# Patient Record
Sex: Male | Born: 1990 | Race: Black or African American | Hispanic: No | Marital: Single | Smoking: Current every day smoker
Health system: Southern US, Community
[De-identification: ages and names within clinical notes are randomized; demographics above are authoritative.]

## PROBLEM LIST (undated history)

## (undated) DIAGNOSIS — J45909 Unspecified asthma, uncomplicated: Secondary | ICD-10-CM

---
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
## 2011-04-17 ENCOUNTER — Emergency Department (HOSPITAL_COMMUNITY)
Admission: EM | Admit: 2011-04-17 | Discharge: 2011-04-17 | Disposition: A | Discharge status: Home / Self Care | Payer: Medicaid Other | Attending: Emergency Medicine | Admitting: Emergency Medicine

## 2011-04-17 ENCOUNTER — Emergency Department (HOSPITAL_BASED_OUTPATIENT_CLINIC_OR_DEPARTMENT_OTHER)
Admission: EM | Admit: 2011-04-17 | Discharge: 2011-04-17 | Discharge status: Against Medical Advice | Payer: Medicaid Other | Source: Home / Self Care | Attending: Emergency Medicine | Admitting: Emergency Medicine

## 2011-04-17 ENCOUNTER — Emergency Department (HOSPITAL_COMMUNITY): Payer: Medicaid Other

## 2011-04-17 ENCOUNTER — Emergency Department (INDEPENDENT_AMBULATORY_CARE_PROVIDER_SITE_OTHER): Payer: Medicaid Other

## 2011-04-17 DIAGNOSIS — R05 Cough: Secondary | ICD-10-CM

## 2011-04-17 DIAGNOSIS — R059 Cough, unspecified: Secondary | ICD-10-CM | POA: Insufficient documentation

## 2011-04-17 DIAGNOSIS — R509 Fever, unspecified: Secondary | ICD-10-CM | POA: Insufficient documentation

## 2011-04-17 DIAGNOSIS — J40 Bronchitis, not specified as acute or chronic: Secondary | ICD-10-CM | POA: Insufficient documentation

## 2011-04-17 DIAGNOSIS — R0602 Shortness of breath: Secondary | ICD-10-CM | POA: Insufficient documentation

## 2011-04-17 DIAGNOSIS — J029 Acute pharyngitis, unspecified: Secondary | ICD-10-CM

## 2011-04-17 DIAGNOSIS — R062 Wheezing: Secondary | ICD-10-CM | POA: Insufficient documentation

## 2011-04-17 DIAGNOSIS — J45909 Unspecified asthma, uncomplicated: Secondary | ICD-10-CM | POA: Insufficient documentation

## 2011-04-17 DIAGNOSIS — F17200 Nicotine dependence, unspecified, uncomplicated: Secondary | ICD-10-CM | POA: Insufficient documentation

## 2011-07-26 ENCOUNTER — Emergency Department (HOSPITAL_BASED_OUTPATIENT_CLINIC_OR_DEPARTMENT_OTHER)
Admission: EM | Admit: 2011-07-26 | Discharge: 2011-07-26 | Disposition: A | Discharge status: Home / Self Care | Payer: Medicaid Other | Attending: Emergency Medicine | Admitting: Emergency Medicine

## 2011-07-26 ENCOUNTER — Encounter: Payer: Self-pay | Admitting: *Deleted

## 2011-07-26 DIAGNOSIS — R599 Enlarged lymph nodes, unspecified: Secondary | ICD-10-CM | POA: Insufficient documentation

## 2011-07-26 DIAGNOSIS — R220 Localized swelling, mass and lump, head: Secondary | ICD-10-CM | POA: Insufficient documentation

## 2011-07-26 DIAGNOSIS — R591 Generalized enlarged lymph nodes: Secondary | ICD-10-CM

## 2011-07-26 MED ORDER — AMOXICILLIN 500 MG PO CAPS: 500.0000 mg | ORAL_CAPSULE | Freq: Three times a day (TID) | ORAL | Status: AC

## 2011-07-26 NOTE — ED Notes (Signed)
Noticed a "lump" under right side of chin this morning states is more painful when he eats

## 2011-07-26 NOTE — ED Provider Notes (Signed)
History     CSN: [id number] Arrival date & time: 07/26/2011 12:43 PM  Chief Complaint  Patient presents with  . Cyst    (Consider location/radiation/quality/duration/timing/severity/associated sxs/prior treatment) HPI Comments: Pt states that since yesterday he has had swelling under his right chin:pt denies fever:pt state that he has had a sore throat:pt state that it is worse with eating:pt denies history of similar symptoms  The history is provided by the patient. No language interpreter was used.    History reviewed. No pertinent past medical history.  History reviewed. No pertinent past surgical history.  No family history on file.  History  Substance Use Topics  . Smoking status: Not on file  . Smokeless tobacco: Not on file  . Alcohol Use: Not on file      Review of Systems  Constitutional: Negative.   Respiratory: Negative.   Cardiovascular: Negative.   Hematological: Positive for adenopathy.    Allergies  Review of patient's allergies indicates not on file.  Home Medications  No current outpatient prescriptions on file.  BP 119/81  Pulse 92  Temp(Src) 98.3 F (36.8 C) (Oral)  Resp 20  SpO2 100%  Physical Exam  Vitals reviewed. Constitutional: He appears well-developed and well-nourished.  HENT:  Head: Normocephalic and atraumatic.  Right Ear: External ear normal.  Left Ear: External ear normal.  Nose: Rhinorrhea present.  Mouth/Throat: Posterior oropharyngeal erythema present.  Cardiovascular: Normal rate and regular rhythm.   Pulmonary/Chest: Effort normal and breath sounds normal.  Musculoskeletal: Normal range of motion.  Neurological: He is alert.    ED Course  Procedures (including critical care time)  Labs Reviewed - No data to display No results found.   No diagnosis found.    MDM  Will treat pt symptomatically with amoxicillin        Teressa Lower, NP 07/26/11 1305

## 2011-07-26 NOTE — ED Provider Notes (Signed)
Medical screening examination/treatment/procedure(s) were performed by non-physician practitioner and as supervising physician I was immediately available for consultation/collaboration.   Dayton Bailiff, MD 07/26/11 1306

## 2012-05-08 DIAGNOSIS — J45909 Unspecified asthma, uncomplicated: Secondary | ICD-10-CM | POA: Insufficient documentation

## 2012-05-08 DIAGNOSIS — T07XXXA Unspecified multiple injuries, initial encounter: Secondary | ICD-10-CM | POA: Insufficient documentation

## 2012-05-08 DIAGNOSIS — M546 Pain in thoracic spine: Secondary | ICD-10-CM | POA: Insufficient documentation

## 2012-05-08 DIAGNOSIS — R51 Headache: Secondary | ICD-10-CM | POA: Insufficient documentation

## 2012-05-08 DIAGNOSIS — M25429 Effusion, unspecified elbow: Secondary | ICD-10-CM | POA: Insufficient documentation

## 2012-05-08 DIAGNOSIS — M545 Low back pain, unspecified: Secondary | ICD-10-CM | POA: Insufficient documentation

## 2012-05-08 DIAGNOSIS — M25529 Pain in unspecified elbow: Secondary | ICD-10-CM | POA: Insufficient documentation

## 2012-05-09 ENCOUNTER — Emergency Department (HOSPITAL_BASED_OUTPATIENT_CLINIC_OR_DEPARTMENT_OTHER): Payer: Medicaid Other

## 2012-05-09 ENCOUNTER — Encounter (HOSPITAL_BASED_OUTPATIENT_CLINIC_OR_DEPARTMENT_OTHER): Payer: Self-pay | Admitting: *Deleted

## 2012-05-09 ENCOUNTER — Emergency Department (HOSPITAL_BASED_OUTPATIENT_CLINIC_OR_DEPARTMENT_OTHER)
Admission: EM | Admit: 2012-05-09 | Discharge: 2012-05-09 | Disposition: A | Discharge status: Home / Self Care | Payer: Medicaid Other | Attending: Emergency Medicine | Admitting: Emergency Medicine

## 2012-05-09 DIAGNOSIS — T07XXXA Unspecified multiple injuries, initial encounter: Secondary | ICD-10-CM

## 2012-05-09 HISTORY — DX: Unspecified asthma, uncomplicated: J45.909

## 2012-05-09 MED ORDER — IBUPROFEN 800 MG PO TABS
800.0000 mg | ORAL_TABLET | Freq: Once | ORAL | Status: AC
  Administered 2012-05-09: 800 mg via ORAL
  Filled 2012-05-09: qty 1
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
## 2012-07-06 ENCOUNTER — Emergency Department (HOSPITAL_BASED_OUTPATIENT_CLINIC_OR_DEPARTMENT_OTHER): Payer: Medicaid Other

## 2012-07-06 ENCOUNTER — Emergency Department (HOSPITAL_BASED_OUTPATIENT_CLINIC_OR_DEPARTMENT_OTHER)
Admission: EM | Admit: 2012-07-06 | Discharge: 2012-07-06 | Disposition: A | Discharge status: Home / Self Care | Payer: Medicaid Other | Attending: Emergency Medicine | Admitting: Emergency Medicine

## 2012-07-06 ENCOUNTER — Encounter (HOSPITAL_BASED_OUTPATIENT_CLINIC_OR_DEPARTMENT_OTHER): Payer: Self-pay | Admitting: *Deleted

## 2012-07-06 DIAGNOSIS — F17200 Nicotine dependence, unspecified, uncomplicated: Secondary | ICD-10-CM | POA: Insufficient documentation

## 2012-07-06 DIAGNOSIS — S61019A Laceration without foreign body of unspecified thumb without damage to nail, initial encounter: Secondary | ICD-10-CM

## 2012-07-06 DIAGNOSIS — J45909 Unspecified asthma, uncomplicated: Secondary | ICD-10-CM | POA: Insufficient documentation

## 2012-07-06 DIAGNOSIS — W268XXA Contact with other sharp object(s), not elsewhere classified, initial encounter: Secondary | ICD-10-CM | POA: Insufficient documentation

## 2012-07-06 DIAGNOSIS — Y92009 Unspecified place in unspecified non-institutional (private) residence as the place of occurrence of the external cause: Secondary | ICD-10-CM | POA: Insufficient documentation

## 2012-07-06 DIAGNOSIS — Z23 Encounter for immunization: Secondary | ICD-10-CM | POA: Insufficient documentation

## 2012-07-06 DIAGNOSIS — S61209A Unspecified open wound of unspecified finger without damage to nail, initial encounter: Secondary | ICD-10-CM | POA: Insufficient documentation

## 2012-07-06 MED ORDER — OXYCODONE-ACETAMINOPHEN 5-325 MG PO TABS
1.0000 | ORAL_TABLET | Freq: Once | ORAL | Status: AC
  Administered 2012-07-06: 1 via ORAL
  Filled 2012-07-06 (×2): qty 1

## 2012-07-06 MED ORDER — LIDOCAINE HCL (PF) 1 % IJ SOLN
5.0000 mL | Freq: Once | INTRAMUSCULAR | Status: AC
  Administered 2012-07-06: 5 mL via INTRADERMAL
  Filled 2012-07-06: qty 5

## 2012-07-06 MED ORDER — HYDROCODONE-ACETAMINOPHEN 5-325 MG PO TABS: 2.0000 | ORAL_TABLET | ORAL | Status: AC | PRN

## 2012-07-06 MED ORDER — TETANUS-DIPHTH-ACELL PERTUSSIS 5-2.5-18.5 LF-MCG/0.5 IM SUSP
0.5000 mL | Freq: Once | INTRAMUSCULAR | Status: AC
  Administered 2012-07-06: 0.5 mL via INTRAMUSCULAR
  Filled 2012-07-06: qty 0.5

## 2012-07-06 NOTE — ED Provider Notes (Signed)
History     CSN: [id number]  Arrival date & time 07/06/12  1406   First MD Initiated Contact with Patient 07/06/12 1936      Chief Complaint  Patient presents with  . Laceration    (Consider location/radiation/quality/duration/timing/severity/associated sxs/prior treatment) Patient is a 21 y.o. male presenting with skin laceration. The history is provided by the patient and a friend.  Laceration    Joshua Russo is a 21 y.o. male presents to the emergency department complaining of a laceration to the left thumb.  The onset of the symptoms was  abrupt starting 7 hours ago.  The patient has associated pain in the extremity.  The symptoms have been  persistent, stabilized.  Nothing makes the symptoms worse and nothing makes symptoms better.  The patient denies fever, chills, headache, chest pain, shortness of breath, nausea, vomiting, diarrhea, numbness and externa, weakness in the extremity.  Patient states he was working with a small accident which fell off the dresser and when he tried to catch it is sliced the tip of his thumb off.     Past Medical History  Diagnosis Date  . Asthma     History reviewed. No pertinent past surgical history.  History reviewed. No pertinent family history.  History  Substance Use Topics  . Smoking status: Current Everyday Smoker -- 0.5 packs/day    Types: Cigarettes  . Smokeless tobacco: Never Used  . Alcohol Use: No      Review of Systems  Constitutional: Negative for fever.  Skin: Positive for wound.  Neurological: Negative for weakness and numbness.    Allergies  Review of patient's allergies indicates no known allergies.  Home Medications   Current Outpatient Rx  Name Route Sig Dispense Refill  . HYDROCODONE-ACETAMINOPHEN 5-325 MG PO TABS Oral Take 2 tablets by mouth every 4 (four) hours as needed for pain. 6 tablet 0    BP 131/84  Pulse 82  Temp 98.8 F (37.1 C) (Oral)  Resp 18  Ht 6\' 1"  (1.854 m)  Wt 216 lb (97.977  kg)  BMI 28.50 kg/m2  SpO2 100%  Physical Exam  Nursing note and vitals reviewed. Constitutional: He appears well-developed and well-nourished. No distress.  HENT:  Head: Normocephalic and atraumatic.  Eyes: Conjunctivae are normal. No scleral icterus.  Cardiovascular: Normal rate, regular rhythm and intact distal pulses.        Capillary refill less than 3 seconds  Pulmonary/Chest: Effort normal and breath sounds normal. No respiratory distress.  Musculoskeletal: Normal range of motion. He exhibits no edema.       Hands:      Full range of motion of the digit and extremity  Neurological: He is alert.       Sensation intact Strength intact  Skin: Skin is warm and dry. He is not diaphoretic.       Large laceration to the tip of the thumb    ED Course  Procedures (including critical care time)  Labs Reviewed - No data to display Dg Finger Thumb Left  07/06/2012  *RADIOLOGY REPORT*  Clinical Data: Laceration.  LEFT THUMB 2+V  Comparison: None  Findings: The joint spaces are maintained.  No acute fracture or radiopaque foreign body.  IMPRESSION: No acute bony findings.   Original Report Authenticated By: P. Loralie Champagne, M.D.    LACERATION REPAIR Performed by: Dierdre Forth Authorized by: Dierdre Forth Consent: Verbal consent obtained. Risks and benefits: risks, benefits and alternatives were discussed Consent given by: patient Patient  identity confirmed: provided demographic data Prepped and Draped in normal sterile fashion Wound explored  Laceration Location: Left thumb  Laceration Length: 2 cm ellipse  No Foreign Bodies seen or palpated  Anesthesia: local infiltration  Local anesthetic: lidocaine 1% without epinephrine  Anesthetic total: 5 ml  Irrigation method: syringe and running water  Amount of cleaning: standard  Skin closure: Unable to suture wound.  Direct pressure held until bleeding stopped after local anesthetic was  administered.  Patient tolerance: Patient tolerated the procedure well with no immediate complications.   1. Laceration of thumb       MDM  Joshua Russo presents with laceration to left thumb.  Unable to suture wound.  Patient instructed on wound care.  Tdap booster given.Pressure irrigation performed. Laceration occurred < 8 hours prior.   Pt has no co morbidities to effect normal wound healing. Discussed suture home care w pt and answered questions. Pt to f-u for wound check as needed. Pt is hemodynamically stable w no complaints prior to dc.  I have also discussed reasons to return immediately to the ER.  Patient expresses understanding and agrees with plan.   1. Medications: Vicodin 2. Treatment: Rest, ice, wound care instructions given 3. Follow Up: With primary care physician or emergency department if wound appears to become infected  Follow up with your doctor, an urgent care, or this Emergency Department as needed for concern for infection. Do not submerge the stitches in water for the first 24 hours. Take your pain medication as prescribed. Do not operate heavy machinery while on pain medication. Note that your pain medication contains acetaminophen (Tylenol) & its is not reccommended that you use additional acetaminophen (Tylenol) while taking this medication. Read instructions below.  TREATMENT   Keep the wound clean and dry.   If you were given a bandage (dressing), you should change it at least once a day. Also, change the dressing if it becomes wet or dirty, or as directed by your caregiver.   Wash the wound with soap and water 2 times a day. Rinse the wound off with water to remove all soap. Pat the wound dry with a clean towel.   You may shower as usual after the first 24 hours. Do not soak the wound in water until the sutures are removed.   Once the wound has healed, scarring can be minimized by covering the wound with sunscreen during the day for 1 full year.Marland Kitchen    SEEK MEDICAL CARE IF:   You have redness, swelling, or increasing pain in the wound.   You see a red line that goes away from the wound.   You have yellowish-white fluid (pus) coming from the wound.   You have a fever.   You notice a bad smell coming from the wound or dressing.   Your wound breaks open before or after sutures have been removed.   You notice something coming out of the wound such as wood or glass.   Your wound is on your hand or foot and you cannot move a finger or toe.   Your pain is not controlled with prescribed medicine.   If you did not receive a tetanus shot today because you thought you were up to date, but did not recall when your last one was given, make sure to check with your primary caregiver to determine if you need one.           Dahlia Client Yoshimi Sarr, PA-C 07/06/12 2045

## 2012-07-06 NOTE — ED Notes (Signed)
Pt states he cut his left thumb with an ax. Moves thumb. Feels touch. Cap refill < 3 sec. Pressure dressing applied and bleeding controlled.

## 2012-07-07 NOTE — ED Provider Notes (Signed)
Medical screening examination/treatment/procedure(s) were performed by non-physician practitioner and as supervising physician I was immediately available for consultation/collaboration.   Shevy Yaney, MD 07/07/12 0033 

## 2012-11-21 ENCOUNTER — Emergency Department (HOSPITAL_BASED_OUTPATIENT_CLINIC_OR_DEPARTMENT_OTHER)
Admission: EM | Admit: 2012-11-21 | Discharge: 2012-11-21 | Disposition: A | Discharge status: Home / Self Care | Payer: Medicaid Other | Attending: Emergency Medicine | Admitting: Emergency Medicine

## 2012-11-21 ENCOUNTER — Encounter (HOSPITAL_BASED_OUTPATIENT_CLINIC_OR_DEPARTMENT_OTHER): Payer: Self-pay | Admitting: *Deleted

## 2012-11-21 DIAGNOSIS — J45909 Unspecified asthma, uncomplicated: Secondary | ICD-10-CM | POA: Insufficient documentation

## 2012-11-21 DIAGNOSIS — R063 Periodic breathing: Secondary | ICD-10-CM | POA: Insufficient documentation

## 2012-11-21 DIAGNOSIS — K529 Noninfective gastroenteritis and colitis, unspecified: Secondary | ICD-10-CM

## 2012-11-21 DIAGNOSIS — K5289 Other specified noninfective gastroenteritis and colitis: Secondary | ICD-10-CM | POA: Insufficient documentation

## 2012-11-21 DIAGNOSIS — R109 Unspecified abdominal pain: Secondary | ICD-10-CM | POA: Insufficient documentation

## 2012-11-21 DIAGNOSIS — R197 Diarrhea, unspecified: Secondary | ICD-10-CM | POA: Insufficient documentation

## 2012-11-21 DIAGNOSIS — F17200 Nicotine dependence, unspecified, uncomplicated: Secondary | ICD-10-CM | POA: Insufficient documentation

## 2012-11-21 DIAGNOSIS — R61 Generalized hyperhidrosis: Secondary | ICD-10-CM | POA: Insufficient documentation

## 2012-11-21 LAB — LIPASE, BLOOD: Lipase: 15 U/L (ref 11–59)

## 2012-11-21 LAB — BASIC METABOLIC PANEL
CO2: 21 mEq/L (ref 19–32)
Calcium: 10.4 mg/dL (ref 8.4–10.5)
Chloride: 104 mEq/L (ref 96–112)
Creatinine, Ser: 1.1 mg/dL (ref 0.50–1.35)
GFR calc Af Amer: >90 mL/min (ref >90)
Sodium: 140 mEq/L (ref 135–145)

## 2012-11-21 LAB — CBC WITH DIFFERENTIAL/PLATELET
Basophils Absolute: 0 10*3/uL (ref 0.0–0.1)
Basophils Relative: 0 % (ref 0–1)
Lymphocytes Relative: 10 % — ABNORMAL LOW (ref 12–46)
MCHC: 34 g/dL (ref 30.0–36.0)
Neutro Abs: 12 10*3/uL — ABNORMAL HIGH (ref 1.7–7.7)
Neutrophils Relative %: 83 % — ABNORMAL HIGH (ref 43–77)
Platelets: 276 10*3/uL (ref 150–400)
RDW: 14.6 % (ref 11.5–15.5)
WBC: 14.5 10*3/uL — ABNORMAL HIGH (ref 4.0–10.5)

## 2012-11-21 MED ORDER — ONDANSETRON HCL 4 MG PO TABS: 4.0000 mg | ORAL_TABLET | Freq: Four times a day (QID) | ORAL | Status: DC

## 2012-11-21 MED ORDER — SODIUM CHLORIDE 0.9 % IV BOLUS (SEPSIS)
1000.0000 mL | Freq: Once | INTRAVENOUS | Status: AC
  Administered 2012-11-21: 1000 mL via INTRAVENOUS

## 2012-11-21 MED ORDER — ONDANSETRON HCL 4 MG/2ML IJ SOLN
4.0000 mg | Freq: Once | INTRAMUSCULAR | Status: AC
  Administered 2012-11-21: 4 mg via INTRAVENOUS
  Filled 2012-11-21: qty 2

## 2012-11-21 MED ORDER — HYDROMORPHONE HCL PF 1 MG/ML IJ SOLN
1.0000 mg | Freq: Once | INTRAMUSCULAR | Status: AC
  Administered 2012-11-21: 1 mg via INTRAVENOUS
  Filled 2012-11-21: qty 1

## 2012-11-21 NOTE — ED Notes (Signed)
Pt asked to provide urine sample but sts he cannot go at this time.

## 2012-11-21 NOTE — ED Notes (Addendum)
Pt sts that his stomach began hurting intermittently last night at apprx. 2300 hours. Pt aslo c/o N/V/D. Pt denies abd pain at this time.

## 2012-11-21 NOTE — ED Provider Notes (Signed)
22 year old male who presents with acute onset of nausea vomiting and watery diarrhea which occurred at 11:30 PM last night. The symptoms have been persistent over the last several hours and involved multiple episodes of vomiting and watery diarrhea including on arrival. On exam the patient has a soft abdomen with epigastric tenderness, no peritoneal signs, no pain at McBurney's point or in the right upper quadrant. He has clear heart and lung sounds, mildly diaphoretic, answering questions and following commands appropriately.  Assessment the patient appears acutely ill with a likely gastroenteritis. There is no indication that this is a food poisoning and she is not having any sick contacts or friends. He has been given IV fluids, IV hydromorphone and IV Zofran with significant improvement in his symptoms and is now tolerating oral fluids.  Labs show that he has a leukocytosis but no other significant findings. At this time the patient appears stable for discharge with prescription medications and followup with family doctor as needed.   Medical screening examination/treatment/procedure(s) were conducted as a shared visit with non-physician practitioner(s) and myself.  I personally evaluated the patient during the encounter    Vida Roller, MD 11/21/12 [phone]

## 2012-11-21 NOTE — ED Notes (Signed)
PA at bedside.

## 2012-11-21 NOTE — ED Provider Notes (Signed)
History     CSN: [id number]  Arrival date & time 11/21/12  0407   First MD Initiated Contact with Patient 11/21/12 [phone]      Chief Complaint  Patient presents with  . Emesis    (Consider location/radiation/quality/duration/timing/severity/associated sxs/prior treatment) HPI Comments: No sick contacts  Patient is a 22 y.o. male presenting with vomiting. The history is provided by the patient and a significant other. No language interpreter was used.  Emesis  This is a new problem. The current episode started 3 to 5 hours ago. The problem occurs 5 to 10 times per day. The problem has been rapidly worsening. The emesis has an appearance of stomach contents. There has been no fever. Associated symptoms include abdominal pain, chills, diarrhea and sweats. Pertinent negatives include no fever and no headaches.    Past Medical History  Diagnosis Date  . Asthma     No past surgical history on file.  No family history on file.  History  Substance Use Topics  . Smoking status: Current Every Day Smoker -- 0.5 packs/day    Types: Cigarettes  . Smokeless tobacco: Never Used  . Alcohol Use: No      Review of Systems  Constitutional: Positive for chills. Negative for fever.  Respiratory: Negative for shortness of breath.   Gastrointestinal: Positive for vomiting, abdominal pain and diarrhea.  Skin: Negative for color change.  Neurological: Negative for syncope and headaches.    Allergies  Review of patient's allergies indicates no known allergies.  Home Medications  No current outpatient prescriptions on file.  BP 114/76  Pulse 100  Temp 97.3 F (36.3 C) (Oral)  Resp 18  SpO2 100%  Physical Exam  Constitutional: He appears well-developed and well-nourished.  HENT:  Head: Normocephalic and atraumatic.  Eyes: No scleral icterus.  Neck: Normal range of motion.  Cardiovascular: Normal rate and regular rhythm.   Pulmonary/Chest: Effort normal and breath sounds normal.  No respiratory distress.  Abdominal: Soft. He exhibits no distension and no mass. There is tenderness. There is no guarding.       No pain at McBurney's point  Musculoskeletal: Normal range of motion.  Neurological: He is alert.  Psychiatric: He has a normal mood and affect. His behavior is normal.    ED Course  Procedures (including critical care time)  Labs Reviewed  CBC WITH DIFFERENTIAL - Abnormal; Notable for the following:    WBC 14.5 (*)     Neutrophils Relative 83 (*)     Neutro Abs 12.0 (*)     Lymphocytes Relative 10 (*)     All other components within normal limits  BASIC METABOLIC PANEL - Abnormal; Notable for the following:    Glucose, Bld 143 (*)     All other components within normal limits  LIPASE, BLOOD   No results found.   1. Gastroenteritis       MDM  Pt is 22 yo male who presents c/o persistent nausea, vomiting, and diarrhea since 11pm last night. When patient arrived to ED, states he had 6-7 episodes of non-bloody, non-bilious emesis and 6-7 episodes of diarrhea. Diarrhea was watery and non-bloody. While obtaining history the patient was diaphoretic and visibly uncomfortable, having another episode of simultaneous vomiting and diarrhea. Patient was immediately started on IV saline bolus, IV zofran and 1mg  dilaudid IV to help his abdominal pain as well as to slow down his bowel activity. CBC, lipase, and BMP were ordered as well. On PE performed by Dr. Hyacinth Meeker,  patient had normal heart and lung sounds, a soft abdomen with no peritoneal signs or tenderness at McBurney's point.   Patient's lab work showed leukocytosis and were otherwise unremarkable. Patient is feeling better after receiving fluids, zofran and dilaudid. Have ordered for a second bolus of normal saline at this time. Anticipate patient will be discharged once bolus is completed.  Patient continues to feel relief of symptoms and is comfortable being discharged. Believe etiology of symptoms is a  viral gastroenteritis. Will discharge with Zofran for relief of nausea should it persist once the patient is home. Will also advise patient to follow up with PCP if necessary and to return to the ED if symptoms worsen.      Antony Madura, [place] 11/21/12 [phone]

## 2012-11-28 NOTE — ED Provider Notes (Signed)
  Medical screening examination/treatment/procedure(s) were conducted as a shared visit with non-physician practitioner(s) and myself.  I personally evaluated the patient during the encounter  Please see my separate respective documentation pertaining to this patient encounter   Vida Roller, MD 11/28/12 [phone]

## 2013-09-24 ENCOUNTER — Emergency Department (HOSPITAL_BASED_OUTPATIENT_CLINIC_OR_DEPARTMENT_OTHER): Payer: Medicaid Other

## 2013-09-24 ENCOUNTER — Emergency Department (HOSPITAL_BASED_OUTPATIENT_CLINIC_OR_DEPARTMENT_OTHER)
Admission: EM | Admit: 2013-09-24 | Discharge: 2013-09-24 | Disposition: A | Discharge status: Home / Self Care | Payer: Medicaid Other | Attending: Emergency Medicine | Admitting: Emergency Medicine

## 2013-09-24 ENCOUNTER — Encounter (HOSPITAL_BASED_OUTPATIENT_CLINIC_OR_DEPARTMENT_OTHER): Payer: Self-pay | Admitting: Emergency Medicine

## 2013-09-24 DIAGNOSIS — W08XXXA Fall from other furniture, initial encounter: Secondary | ICD-10-CM | POA: Insufficient documentation

## 2013-09-24 DIAGNOSIS — Y929 Unspecified place or not applicable: Secondary | ICD-10-CM | POA: Insufficient documentation

## 2013-09-24 DIAGNOSIS — F17200 Nicotine dependence, unspecified, uncomplicated: Secondary | ICD-10-CM | POA: Insufficient documentation

## 2013-09-24 DIAGNOSIS — Y939 Activity, unspecified: Secondary | ICD-10-CM | POA: Insufficient documentation

## 2013-09-24 DIAGNOSIS — J45909 Unspecified asthma, uncomplicated: Secondary | ICD-10-CM | POA: Insufficient documentation

## 2013-09-24 DIAGNOSIS — S93409A Sprain of unspecified ligament of unspecified ankle, initial encounter: Secondary | ICD-10-CM | POA: Insufficient documentation

## 2013-09-24 DIAGNOSIS — S93402A Sprain of unspecified ligament of left ankle, initial encounter: Secondary | ICD-10-CM

## 2013-09-24 MED ORDER — HYDROCODONE-ACETAMINOPHEN 5-325 MG PO TABS
ORAL_TABLET | ORAL | Status: AC
  Filled 2013-09-24: qty 1

## 2013-09-24 MED ORDER — IBUPROFEN 600 MG PO TABS: 600.0000 mg | ORAL_TABLET | Freq: Four times a day (QID) | ORAL | Status: DC | PRN

## 2013-09-24 MED ORDER — IBUPROFEN 400 MG PO TABS
600.0000 mg | ORAL_TABLET | Freq: Once | ORAL | Status: AC
  Administered 2013-09-24: 600 mg via ORAL

## 2013-09-24 MED ORDER — HYDROCODONE-ACETAMINOPHEN 5-325 MG PO TABS
1.0000 | ORAL_TABLET | Freq: Once | ORAL | Status: AC
  Administered 2013-09-24: 1 via ORAL

## 2013-09-24 MED ORDER — HYDROCODONE-ACETAMINOPHEN 5-325 MG PO TABS: 1.0000 | ORAL_TABLET | ORAL | Status: DC | PRN

## 2013-09-24 MED ORDER — IBUPROFEN 400 MG PO TABS
ORAL_TABLET | ORAL | Status: AC
  Filled 2013-09-24: qty 1

## 2013-09-24 MED ORDER — IBUPROFEN 200 MG PO TABS
ORAL_TABLET | ORAL | Status: AC
  Filled 2013-09-24: qty 1

## 2013-09-24 NOTE — ED Notes (Signed)
MD at bedside. 

## 2013-09-24 NOTE — ED Notes (Signed)
Lrg ice bag placed on Pts Lt ankle 

## 2013-09-24 NOTE — ED Notes (Signed)
Fell off of porch - approx 18 inches - injuring left ankle.  Swelling noted.  Sts he is unable to bear weight.

## 2013-09-24 NOTE — ED Notes (Signed)
Patient placed in air splint and fitted appropriately for crutches. Patient demonstrates appropriate use of crutches while in the department and verbalizes understanding

## 2013-09-25 NOTE — ED Provider Notes (Signed)
CSN: [id number]     Arrival date & time 09/24/13  2258 History   First MD Initiated Contact with Patient 09/24/13 2307     Chief Complaint  Patient presents with  . Ankle Injury   (Consider location/radiation/quality/duration/timing/severity/associated sxs/prior Treatment) HPI Patient stepped down approximately 18 inches onto left foot roughly 5 hours before presentation to the emergency department ruling left ankle. He had immediate pain. He's had increasing swelling to the lateral surface of left ankle since the time of the injury. He's had difficulty weightbearing due to pain. He denies any knee or other injuries. Denies any numbness or weakness. Past Medical History  Diagnosis Date  . Asthma    History reviewed. No pertinent past surgical history. No family history on file. History  Substance Use Topics  . Smoking status: Current Every Day Smoker -- 0.25 packs/day    Types: Cigarettes  . Smokeless tobacco: Never Used  . Alcohol Use: No    Review of Systems  Musculoskeletal: Positive for arthralgias.  Skin: Negative for color change, rash and wound.  Neurological: Negative for weakness and numbness.  All other systems reviewed and are negative.    Allergies  Review of patient's allergies indicates no known allergies.  Home Medications   Current Outpatient Rx  Name  Route  Sig  Dispense  Refill  . HYDROcodone-acetaminophen (NORCO) 5-325 MG per tablet   Oral   Take 1 tablet by mouth every 4 (four) hours as needed for moderate pain.   10 tablet   0   . ibuprofen (ADVIL,MOTRIN) 600 MG tablet   Oral   Take 1 tablet (600 mg total) by mouth every 6 (six) hours as needed.   30 tablet   0   . ondansetron (ZOFRAN) 4 MG tablet   Oral   Take 1 tablet (4 mg total) by mouth every 6 (six) hours.   12 tablet   0    BP 136/77  Pulse 112  Temp(Src) 99.5 F (37.5 C) (Oral)  Resp 16  Ht 6' (1.829 m)  Wt 225 lb (102.059 kg)  BMI 30.51 kg/m2  SpO2 98% Physical Exam   Nursing note and vitals reviewed. Constitutional: He is oriented to person, place, and time. He appears well-developed and well-nourished. No distress.  HENT:  Head: Normocephalic and atraumatic.  Mouth/Throat: Oropharynx is clear and moist.  Eyes: EOM are normal. Pupils are equal, round, and reactive to light.  Neck: Normal range of motion. Neck supple.  Cardiovascular: Normal rate.   Pulmonary/Chest: Effort normal.  Abdominal: Soft.  Musculoskeletal: He exhibits edema and tenderness.  Patient with left ankle swelling especially of the lateral malleolus. He is tender to palpation of the lateral cardiologist and just proximal to that. He has no proximal fibula tenderness exhibits full range of motion of his left knee. Dorsalis pedis pulses are intact. No obvious bony deformity.  Neurological: He is alert and oriented to person, place, and time.  Skin: Skin is warm and dry. No rash noted. No erythema.  Psychiatric: He has a normal mood and affect. His behavior is normal.    ED Course  Procedures (including critical care time) Labs Review Labs Reviewed - No data to display Imaging Review Dg Ankle Complete Left  09/24/2013   CLINICAL DATA:  Fall.  Left ankle injury, pain, and swelling.  EXAM: LEFT ANKLE COMPLETE - 3+ VIEW  COMPARISON:  None.  FINDINGS: There is no evidence of fracture, dislocation, or joint effusion. There is no evidence of arthropathy  or other focal bone abnormality. Soft tissues seen overlying the lateral malleolus.  IMPRESSION: Lateral soft tissue swelling.  No evidence of fracture.   Electronically Signed   By: Myles Rosenthal M.D.   On: 09/24/2013 23:24    EKG Interpretation   None       MDM   1. Ankle sprain, left, initial encounter    Place an air cast and given crutches. Patient advised to keep ankle elevated and use ice. Will give prescription for NSAID's. Return precautions given.    Loren Racer, MD 09/25/13 [phone]

## 2016-02-13 ENCOUNTER — Encounter (HOSPITAL_BASED_OUTPATIENT_CLINIC_OR_DEPARTMENT_OTHER): Payer: Self-pay

## 2016-02-13 ENCOUNTER — Emergency Department (HOSPITAL_BASED_OUTPATIENT_CLINIC_OR_DEPARTMENT_OTHER)
Admission: EM | Admit: 2016-02-13 | Discharge: 2016-02-13 | Disposition: A | Discharge status: Home / Self Care | Payer: Medicaid Other | Attending: Emergency Medicine | Admitting: Emergency Medicine

## 2016-02-13 DIAGNOSIS — Y998 Other external cause status: Secondary | ICD-10-CM | POA: Insufficient documentation

## 2016-02-13 DIAGNOSIS — S61411A Laceration without foreign body of right hand, initial encounter: Secondary | ICD-10-CM | POA: Diagnosis present

## 2016-02-13 DIAGNOSIS — Y9389 Activity, other specified: Secondary | ICD-10-CM | POA: Diagnosis not present

## 2016-02-13 DIAGNOSIS — F17210 Nicotine dependence, cigarettes, uncomplicated: Secondary | ICD-10-CM | POA: Diagnosis not present

## 2016-02-13 DIAGNOSIS — Y9289 Other specified places as the place of occurrence of the external cause: Secondary | ICD-10-CM | POA: Insufficient documentation

## 2016-02-13 DIAGNOSIS — J45909 Unspecified asthma, uncomplicated: Secondary | ICD-10-CM | POA: Diagnosis not present

## 2016-02-13 DIAGNOSIS — W260XXA Contact with knife, initial encounter: Secondary | ICD-10-CM | POA: Diagnosis not present

## 2016-02-13 MED ORDER — BACITRACIN ZINC 500 UNIT/GM EX OINT
TOPICAL_OINTMENT | Freq: Two times a day (BID) | CUTANEOUS | Status: DC
  Administered 2016-02-13: 19:00:00 via TOPICAL
  Filled 2016-02-13: qty 28.35

## 2016-02-13 MED ORDER — LIDOCAINE HCL 1 % IJ SOLN
INTRAMUSCULAR | Status: AC
  Administered 2016-02-13: 17:00:00
  Filled 2016-02-13: qty 20

## 2016-02-13 MED ORDER — HYDROCODONE-ACETAMINOPHEN 5-325 MG PO TABS
1.0000 | ORAL_TABLET | Freq: Once | ORAL | Status: AC
  Administered 2016-02-13: 1 via ORAL
  Filled 2016-02-13: qty 1

## 2016-02-13 NOTE — ED Provider Notes (Signed)
CSN: [id number]     Arrival date & time 02/13/16  1608 History   First MD Initiated Contact with Patient 02/13/16 1624     Chief Complaint  Patient presents with  . Extremity Laceration   (Consider location/radiation/quality/duration/timing/severity/associated sxs/prior Treatment) The history is provided by the patient and medical records. No language interpreter was used.   Joshua Russo is a 25 y.o. male  with a PMH of asthma who presents to the Emergency Department complaining of a laceration over the volar MP joint which occurred just prior to arrival. Patient was washing dishes and did not realize there was a knife in the sink. He accidentally grabbed a serrated knife which sliced hand. Patient is also complaining of the inability to move his pinky finger. No treatments or medications taken prior to arrival. Tetanus up-to-date.   Past Medical History  Diagnosis Date  . Asthma    History reviewed. No pertinent past surgical history. No family history on file. Social History  Substance Use Topics  . Smoking status: Current Every Day Smoker -- 0.25 packs/day    Types: Cigarettes  . Smokeless tobacco: Never Used  . Alcohol Use: No    Review of Systems  Musculoskeletal: Positive for myalgias.  Skin: Positive for wound.      Allergies  Review of patient's allergies indicates no known allergies.  Home Medications   Prior to Admission medications   Not on File   BP 122/85 mmHg  Pulse 78  Temp(Src) 99 F (37.2 C) (Oral)  Resp 18  Ht 6' (1.829 m)  Wt 99.338 kg  BMI 29.70 kg/m2  SpO2 100% Physical Exam  Constitutional: He is oriented to person, place, and time. He appears well-developed and well-nourished.  Alert and in no acute distress  HENT:  Head: Normocephalic and atraumatic.  Cardiovascular: Normal rate, regular rhythm, normal heart sounds and intact distal pulses.  Exam reveals no gallop and no friction rub.   No murmur heard. Pulmonary/Chest: Effort normal  and breath sounds normal. No respiratory distress. He has no wheezes. He has no rales. He exhibits no tenderness.  Abdominal: Soft. Bowel sounds are normal. He exhibits no distension and no mass. There is no tenderness. There is no rebound and no guarding.  Musculoskeletal:  Fifth digit right hand with inability to perform flexion. Right upper extremity sensation is intact. 2+ radial pulse.  Neurological: He is alert and oriented to person, place, and time.  Skin: Skin is warm and dry.  4 cm open laceration over the volar aspect of the fifth MP joint on the right hand.  Nursing note and vitals reviewed.   ED Course  Procedures (including critical care time)  LACERATION REPAIR Performed by: Chase PicketJaime Pilcher Ward  Authorized by: Chase PicketJaime Pilcher Ward Patient identity confirmed: provided demographic data Consent: Verbal consent obtained. Consent given by: patient  Risks and benefits: risks, benefits and alternatives were discussed Prepped and Draped in normal sterile fashion Wound explored Body area: right hand Location details: over 5th MP joint Laceration length: 4 cm Foreign bodies: No foreign bodies seen or palpated. Tendon involvement: Likely Nerve involvement: none Anesthesia: local infiltration Local anesthetic: lidocaine 1% Anesthetic total: 6 ml Patient sedated: no Irrigation solution: saline Irrigation method: syringe Amount of cleaning: standard Skin closure: 6-0 Number of sutures: 7 Technique: simple interrupted Approximation: close Dressing: antibiotic ointment Patient tolerance: Patient tolerated the procedure well with no immediate complications.  Labs Review Labs Reviewed - No data to display  Imaging Review No results  found. I have personally reviewed and evaluated these images and lab results as part of my medical decision-making.   EKG Interpretation None      MDM   Final diagnoses:  Hand laceration, right, initial encounter   Joshua Russo is a  25 y.o. male who presents to ED for finger laceration which occurred just prior to arrival. Tetanus up-to-date. On exam, patient with inability to flex fifth digit, concern for a flexor tendon injury. Hand surgery, Dr. Melvyn Novas, was consulted who recommends closing the wound and he will see the patient in the office, likely tomorrow. Laceration was repaired as dictated above. Home care instructions were discussed. Patient is aware of follow-up care. All questions were answered.    Chase Picket Ward, PA-C 02/13/16 1908  Chase Picket Ward, PA-C 02/13/16 1909  Jerelyn Scott, MD 02/13/16 [phone]

## 2016-02-13 NOTE — ED Notes (Signed)
Cut right hand with serrated knife while washing dishes at home-lac noted to palm below pinky finger-bleeding controlled-gauze kling dsg applied-NAD-steady gait

## 2016-02-13 NOTE — Discharge Instructions (Signed)
Call the hand surgeon listed first thing in the morning. He is aware of today's visit, and has agreed to see you, most likely tomorrow. Please let them know this when you call. Keep the affected area clean and dry. Ibuprofen as needed for pain.  Return to ER for any new or worsening symptoms, any additional concerns.

## 2016-03-07 ENCOUNTER — Encounter: Payer: Self-pay | Admitting: Occupational Therapy

## 2016-03-07 ENCOUNTER — Ambulatory Visit: Payer: Medicaid Other | Attending: Orthopedic Surgery | Admitting: Occupational Therapy

## 2016-03-07 DIAGNOSIS — R600 Localized edema: Secondary | ICD-10-CM | POA: Insufficient documentation

## 2016-03-07 DIAGNOSIS — M79641 Pain in right hand: Secondary | ICD-10-CM | POA: Diagnosis present

## 2016-03-07 DIAGNOSIS — R278 Other lack of coordination: Secondary | ICD-10-CM | POA: Diagnosis not present

## 2016-03-07 DIAGNOSIS — M6281 Muscle weakness (generalized): Secondary | ICD-10-CM | POA: Diagnosis present

## 2016-03-07 NOTE — Patient Instructions (Addendum)
WEARING SCHEDULE:  Wear splint at ALL times except for hygiene care (May remove top strap for exercises and then immediately place back on ONLY if directed by the therapist)  PURPOSE:  To prevent movement and for protection until injury can heal  CARE OF SPLINT:  Keep splint away from heat sources including: stove, radiator or furnace, or a car in sunlight. The splint can melt and will no longer fit you properly  Keep away from pets and children  Clean the splint with rubbing alcohol 1-2 times per day.  * During this time, make sure you also clean your hand/arm as instructed by your therapist and/or perform dressing changes as needed. Then dry hand/arm completely before replacing splint. (When cleaning hand/arm, keep it immobilized in same position until splint is replaced)  PRECAUTIONS/POTENTIAL PROBLEMS: *If you notice or experience increased pain, swelling, numbness, or a lingering reddened area from the splint: Contact your therapist immediately by calling [phone]. You must wear the splint for protection, but we will get you scheduled for adjustments as quickly as possible.  (If only straps or hooks need to be replaced and NO adjustments to the splint need to be made, just call the office ahead and let them know you are coming in)  If you have any medical concerns or signs of infection, please call your doctor immediately  Handout was also issued & reviewed re: Modified Duran home ex program w/in protective dorsal block/splint.

## 2016-03-07 NOTE — Therapy (Signed)
[facility] [address] [place], [place], [address] Phone: [phone]   Fax:  [phone]  Occupational Therapy Evaluation  Patient Details  Name: Joshua Russo MRN: [id number] Date of Birth: 27-Nov-1990 Referring Provider: Dr. Melvyn Novasrtmann  Encounter Date: 03/07/2016      OT End of Session - 03/07/16 1135    Visit Number 1   Number of Visits --  Requested Medicaid Approval   Date for OT Re-Evaluation 05/30/16   Authorization Type Medicaid - awaiting/requesting authorization on 03/07/16   OT Start Time 1013   OT Stop Time 1110   OT Time Calculation (min) 57 min   Equipment Utilized During Treatment Splinting material, gauze, stockinette   Activity Tolerance Patient tolerated treatment well   Behavior During Therapy [facility] for tasks assessed/performed      Past Medical History  Diagnosis Date  . Asthma     History reviewed. No pertinent past surgical history.  There were no vitals filed for this visit.      Subjective Assessment - 03/07/16 1115    Subjective  Pt is a 25 y/o RHD male s/p laceration and repair to right small finger FDS/FDP, radial and ulnar digital nerves zone II on 02/15/16, DOI: 02/14/16. He presents today for protective splinting and OT/eval and treatment.           [facility] Assessment - 03/07/16 0001    Assessment   Diagnosis Right small finger laceration and repair to small finger FDS/FDP, radial and ulnar digital nerves zone II   Referring Provider Dr. Melvyn Novasrtmann   Onset Date 02/15/16  DOI: 02/14/16   Precautions   Precautions Other (comment)  No functional use right dominant hand   Required Braces or Orthoses Other Brace/Splint  Dorsal blocking splint for protection   Home  Environment   Family/patient expects to be discharged to: Private residence   Available Help at Discharge Family   Prior Function   Level of Independence Independent   Written Expression   Dominant Hand Right   Observation/Other Assessments   Observations Pt presents to appointment wearing post-op/surgical splint and dressing R dominant hand   Skin Integrity Minimal maceration noted 3rd & 4th websapces  Pt states "my hand sweats all the time"   Sensation   Light Touch Impaired by gross assessment  c/o numbness/paresthesias right small finger   Edema   Edema Right hand small and ring fingers w/ minimal edema noted                  OT Treatments/Exercises (OP) - 03/07/16 0001    Splinting   Splinting Pt post-splint/dressings were removed in clinic (no drainage or reddness was noted, volar small finger/palm sutures are observed) & protective position of right wrist flexion and digital resting flexion was maintained throughout session; Right hand/wrist were cleaned w/ saline & 2x2's taking care to avoid volar palm and suture areas, skin was dried and pt was re-dressed using gauze and stockinette. Pt/sig other were educated in possible signs and symptoms of infection and keeping hand dry at all times secondary to maceration noted in 3rd and 4th webspaces. A custom protective splint was then fabricated for right dominant hand placing right wrist in ~20* palmar flexion, MP's ~70* flexion and IP extension. Pt was educated in splint wear and care and he was then instructed in Modified [place] Program to [place] of passive flexion of DIP, passive flexion of PIP and composite passive flexion all within protective dorsal blocking  splint. Pt was educated to perform 10-15 reps each exercise hourly while wake to assist with tendon excursion/gliding and decreasing edema. Pt/significant other verbalized understanding in clinic today. Handouts were issued for ex's and splint wear and care as well.   No functional use right hand               OT Education - 03/07/16 1134    Education provided Yes   Education Details Modified Duran ex's w/ in splint; protective splint use, care and precautions/no  functional use R hand   Person(s) Educated Patient;Other (comment)  significant other   Methods Explanation;Demonstration;Verbal cues;Handout;Tactile cues   Comprehension Verbalized understanding;Returned demonstration;Need further instruction          OT Short Term Goals - 03/07/16 1150    OT SHORT TERM GOAL #1   Title Pt will be I protective splinting wear, care and precautions Right dominant hand           OT Long Term Goals - 03/07/16 1151    OT LONG TERM GOAL #1   Title Pt will be independent upgraded  HEP    Time 8   Period Weeks   Status New   OT LONG TERM GOAL #2   Title AROM WNL's for grip, right small finger flexion/extension & functional use during ADL's/IADL's right dominant hand   Time 8   Period Weeks   Status New   OT LONG TERM GOAL #3   Title Pt will be mod I desensitization techniques of right small finger/volar hand scar   Time 8   Period Weeks   Status New               Plan - 03/07/16 1208    Clinical Impression Statement Pt is a 25 y/o right hand dominant male s/p right small finger FDS/FDP, radial and ulnar digital nerve repairs ([address] flexor tendon repair zone 2). He presents to out-pt clinic today for protective splinting following tendon and nerve repairs as well as modified duran home program as per tendon and nerve healing. He is currently 3 weeks pot-op and was fitted with a custom protective dorsal block splint (wrist ~20* flexion, MP's ~70* flexion and IP' extended), he was also educated in [employer] home program. He will require ongoing out-pt OT to assist with maximizing functional use right dominant hand as per Modified Duran program and tendon/nerve healing for eventual return to normal functional use right dominant hand for all activity.  [address] flexor tendon repair zone 2      Patient will benefit from skilled therapeutic intervention in order to improve the following deficits and impairments:  Decreased coordination,  Decreased range of motion, Increased edema, Decreased activity tolerance, Pain, Impaired UE functional use, Decreased scar mobility, Decreased strength, Decreased knowledge of precautions, Other (comment) (Tendon and digital nerve reapirs right small finger)  Visit Diagnosis: Other lack of coordination - Plan: Ot plan of care cert/re-cert  Muscle weakness (generalized) - Plan: Ot plan of care cert/re-cert  Pain, hand joint, right - Plan: Ot plan of care cert/re-cert  Localized edema - Plan: Ot plan of care cert/re-cert    Problem List Patient Active Problem List   Diagnosis Date Noted  . ATTENTION DEFICIT, W/HYPERACTIVITY 12/26/2006  . ASTHMA, UNSPECIFIED 12/26/2006  . GASTROESOPHAGEAL REFLUX, NO ESOPHAGITIS 12/26/2006  . ACNE 12/26/2006    Mariam Dollar Beth Dixon, OTR/L 03/07/2016, 12:16 PM  [facility] [address] [place], [place], [address] Phone: [phone]  Fax:  [phone]  Name: Joshua Russo MRN: [id number] Date of Birth: 20-Feb-1991

## 2016-03-13 NOTE — Addendum Note (Signed)
Addended by: Mariam DollarBARNHILL, AMY B on: 03/13/2016 11:29 AM   Modules accepted: Orders

## 2016-03-29 ENCOUNTER — Ambulatory Visit: Payer: Medicaid Other | Attending: Orthopedic Surgery | Admitting: Occupational Therapy

## 2016-03-29 DIAGNOSIS — R600 Localized edema: Secondary | ICD-10-CM | POA: Diagnosis present

## 2016-03-29 DIAGNOSIS — M79641 Pain in right hand: Secondary | ICD-10-CM

## 2016-03-29 DIAGNOSIS — R278 Other lack of coordination: Secondary | ICD-10-CM | POA: Diagnosis present

## 2016-03-29 DIAGNOSIS — M6281 Muscle weakness (generalized): Secondary | ICD-10-CM | POA: Insufficient documentation

## 2016-03-29 NOTE — Therapy (Signed)
[facility] [address] [place], [place], [address] Phone: [phone]   Fax:  [phone]  Occupational Therapy Treatment  Patient Details  Name: Joshua Russo MRN: [id number] Date of Birth: 14-Aug-1991 Referring Provider: Dr. Caralyn Guile  Encounter Date: 03/29/2016      OT End of Session - 03/29/16 1140    Visit Number 2   Date for OT Re-Evaluation 05/30/16   Authorization Type Medicaid -    Authorization Time Period approved 3 visits from 03/16/16 - 06/07/16   Authorization - Visit Number 1   Authorization - Number of Visits 3   OT Start Time 5093   OT Stop Time 0930   OT Time Calculation (min) 46 min   Activity Tolerance Patient tolerated treatment well      Past Medical History  Diagnosis Date  . Asthma     No past surgical history on file.  There were no vitals filed for this visit.      Subjective Assessment - 03/29/16 0915    Subjective  We haven't been in a while b/c we were waiting on MCD approval   Patient is accompained by: Family member   Pertinent History flexor tendon repair Rt small finger 02/15/16   Patient Stated Goals To get my hand better   Currently in Pain? Yes   Pain Score 8    Pain Location Hand  last 3 digits   Pain Orientation Right   Pain Descriptors / Indicators Sharp   Pain Type Acute pain   Pain Onset More than a month ago   Pain Frequency Intermittent   Aggravating Factors  pain only with P/ROM   Pain Relieving Factors rest            [facility] OT Assessment - 03/29/16 0001    Right Hand AROM   R Little PIP 0-100 75 Degrees  ext - -45*   R Little DIP 0-70 --  20-30 degrees arc                  OT Treatments/Exercises (OP) - 03/29/16 0001    ADLs   ADL Comments Pt educated in scar massage and how to perform at home. Pt instructed to wean from DBS per protocol - pt agreed. Pt instructed to wash hand today and scrub d/t dead skin remaining over incision. All  remaining dead skin fell off by end of session. Pt instructed to wash hands as normal and no longer needs to wear splint when showering.    Exercises   Exercises Hand   Hand Exercises   Other Hand Exercises Updated HEP to A/ROM and P/ROM ex's outside splint per protocol (see pt instructions for details) - pt return demo of each                OT Education - 03/29/16 0931    Education provided Yes   Education Details scar massage, updated HEP (A/ROM and P/ROM outside splint), weaning from splint   Person(s) Educated Patient;Caregiver(s)   Methods Explanation;Demonstration;Handout   Comprehension Verbalized understanding;Returned demonstration          OT Short Term Goals - 03/29/16 1141    OT SHORT TERM GOAL #1   Title Pt will be I protective splinting wear, care and precautions Right dominant hand   Baseline VC's required   Time 4   Period Weeks   Status Achieved   OT SHORT TERM GOAL #2   Title Pt wil lbe Mod I  Modified [place] HEP right small finger   Baseline Verbal and tactile cues required for proper positioning   Time 4   Period Weeks   Status Achieved           OT Long Term Goals - 03/29/16 1141    OT LONG TERM GOAL #1   Title Pt will be independent upgraded  HEP    Baseline To be issued as per tendon & nerve healing   Time 8   Period Weeks   Status On-going   OT LONG TERM GOAL #2   Title AROM WNL's for grip, right small finger flexion/extension & functional use during ADL's/IADL's right dominant hand   Baseline Impaired - No functional use right hand at this time per tendon and nerve repairs/healing   Time 8   Period Weeks   Status New   OT LONG TERM GOAL #3   Title Pt will be mod I desensitization techniques of right small finger/volar hand scar   Baseline Hypersensitivity along surgical scars noted volar palm   Time 8   Period Weeks   Status Achieved               Plan - 03/29/16 1142    Clinical Impression Statement Pt now 6 weeks  post-op for flexor tendon repair Rt small finger. Pt  is very limited in active and passive range of motion of small finger especially at PIP and DIP joint. Pt with extensor lag of -45 degrees. Pt has met STG's and LTG #3   OT Treatment/Interventions Therapeutic exercise;Patient/family education;Splinting;Manual Therapy;Ultrasound;Therapeutic exercises;Therapeutic activities;Fluidtherapy;Scar mobilization;Passive range of motion;Self-care/ADL training   Plan Fabricate PM extension splint for Rt small finger PIP joint, review HEP issued on 03/29/16   Consulted and Agree with Plan of Care Patient;Family member/caregiver   Family Member Consulted Significant other      Patient will benefit from skilled therapeutic intervention in order to improve the following deficits and impairments:  Decreased coordination, Decreased range of motion, Increased edema, Decreased activity tolerance, Pain, Impaired UE functional use, Decreased scar mobility, Decreased strength, Decreased knowledge of precautions, Other (comment)  Visit Diagnosis: Pain, hand joint, right  Localized edema  Other lack of coordination    Problem List Patient Active Problem List   Diagnosis Date Noted  . ATTENTION DEFICIT, W/HYPERACTIVITY 12/26/2006  . ASTHMA, UNSPECIFIED 12/26/2006  . GASTROESOPHAGEAL REFLUX, NO ESOPHAGITIS 12/26/2006  . ACNE 12/26/2006    Carey Bullocks, OTR/L 03/29/2016, 11:46 AM  [facility] [address] [place], [place], [address] Phone: [phone]   Fax:  [phone]  Name: Joshua Russo MRN: [id number] Date of Birth: Jul 06, 1991

## 2016-03-29 NOTE — Patient Instructions (Signed)
   SCAR MASSAGE: Perform for 5 minute sessions, 2x/day in deep circular motions along incision with cocoa butter or Vitamin E cream   EXERCISES: Perform 6-8x/day (about every 1.5 to 2 hours), 15-20 reps each. (For passive stretches with assist from other hand, hold at least 10 seconds for bending and straightening)  1. Bend wrist down while making fist, then lift wrist up while straightening fingers 2. Make fist, then straighten big knuckles, then straighten fingers 3. Make fist, then move wrist up and down  4. Perform "hook" position by keeping big knuckles straight, and bending middle and tip joints of fingers, then push further with the other hand (passive), hold position, take hand away and hold new position, then straighten fingers back  5. Place big knuckles in bent position, focus on straightening middle joints, then press further straight with other hand and hold 20 sec while keeping big knuckle slightly bent

## 2016-04-05 ENCOUNTER — Ambulatory Visit: Payer: Medicaid Other | Admitting: Occupational Therapy

## 2016-04-05 DIAGNOSIS — M79641 Pain in right hand: Secondary | ICD-10-CM | POA: Diagnosis not present

## 2016-04-05 DIAGNOSIS — R600 Localized edema: Secondary | ICD-10-CM

## 2016-04-05 DIAGNOSIS — M6281 Muscle weakness (generalized): Secondary | ICD-10-CM

## 2016-04-05 DIAGNOSIS — R278 Other lack of coordination: Secondary | ICD-10-CM

## 2016-04-05 NOTE — Patient Instructions (Signed)
Your Splint This splint should initially be fitted by a healthcare practitioner.  The healthcare practitioner is responsible for providing wearing instructions and precautions to the patient, other healthcare practitioners and care provider involved in the patient's care.  This splint was custom made for you. Please read the following instructions to learn about wearing and caring for your splint.  Precautions Should your splint cause any of the following problems, remove the splint immediately and contact your therapist/physician.  Swelling  Severe Pain  Pressure Areas  Stiffness  Numbness  Do not wear your splint while operating machinery unless it has been fabricated for that purpose.  When To Wear Your Splint Where your splint according to your therapist/physician instructions. Wear splint for 1-2 hrs tonight while at rest, check skin for pressure areas and redness that does not go away after 10-15 mins, if problems remove splint and stop wearing, if no problems wear at night Nighttime only, stretch finger initially before application  Care and Cleaning of Your Splint 1. Keep your splint away from open flames. 2. Your splint will lose its shape in temperatures over 135 degrees Farenheit, ( in car windows, near radiators, ovens or in hot water).  Never make any adjustments to your splint, if the splint needs adjusting remove it and make an appointment to see your therapist. 3. Your splint, including the cushion liner may be cleaned with soap and lukewarm water.  Do not immerse in hot water over 135 degrees Farenheit. 4. Straps may be washed with soap and water, but do not moisten the self-adhesive portion. 5. For ink or hard to remove spots use a scouring cleanser which contains chlorine.  Rinse the splint thoroughly after using chlorine cleanser. 6.

## 2016-04-05 NOTE — Therapy (Signed)
[facility] [address] [place], [place], [address] Phone: [phone]   Fax:  [phone]  Occupational Therapy Treatment  Patient Details  Name: Joshua Russo MRN: [id number] Date of Birth: 17-Sep-1991 Referring Provider: Dr. Melvyn Novas  Encounter Date: 04/05/2016    Past Medical History  Diagnosis Date  . Asthma     No past surgical history on file.  There were no vitals filed for this visit.      Pt was fitted with a night time extension splint and educated in wear, care, and precautions. A/ROM composite flexion, place and hold, PIP flexion/ extension P/ROM                      OT Education - 04/05/16 1304    Education provided Yes   Education Details night time splint wear, care and prec.   Person(s) Educated Patient   Methods Explanation;Demonstration;Verbal cues;Handout   Comprehension Verbalized understanding;Returned demonstration;Verbal cues required          OT Short Term Goals - 03/29/16 1141    OT SHORT TERM GOAL #1   Title Pt will be I protective splinting wear, care and precautions Right dominant hand   Baseline VC's required   Time 4   Period Weeks   Status Achieved   OT SHORT TERM GOAL #2   Title Pt wil lbe Mod I Modified Durna HEP right small finger   Baseline Verbal and tactile cues required for proper positioning   Time 4   Period Weeks   Status Achieved           OT Long Term Goals - 03/29/16 1141    OT LONG TERM GOAL #1   Title Pt will be independent upgraded  HEP    Baseline To be issued as per tendon & nerve healing   Time 8   Period Weeks   Status On-going   OT LONG TERM GOAL #2   Title AROM WNL's for grip, right small finger flexion/extension & functional use during ADL's/IADL's right dominant hand   Baseline Impaired - No functional use right hand at this time per tendon and nerve repairs/healing   Time 8   Period Weeks   Status New   OT LONG  TERM GOAL #3   Title Pt will be mod I desensitization techniques of right small finger/volar hand scar   Baseline Hypersensitivity along surgical scars noted volar palm   Time 8   Period Weeks   Status Achieved               Plan - 04/05/16 1224    Clinical Impression Statement Pt is progressing slowly towards goals with significant PIP flexion contracture. Pt was fitted with a night time extension splint  to maximize PIP extension as able.   Rehab Potential Good   OT Frequency 2x / week  3 visits   OT Duration 8 weeks   OT Treatment/Interventions Therapeutic exercise;Patient/family education;Splinting;Manual Therapy;Ultrasound;Therapeutic exercises;Therapeutic activities;Fluidtherapy;Scar mobilization;Passive range of motion;Self-care/ADL training   Plan splint check, progress per protocol   Consulted and Agree with Plan of Care Patient;Family member/caregiver   Family Member Consulted Significant other      Patient will benefit from skilled therapeutic intervention in order to improve the following deficits and impairments:  Decreased coordination, Decreased range of motion, Increased edema, Decreased activity tolerance, Pain, Impaired UE functional use, Decreased scar mobility, Decreased strength, Decreased knowledge of precautions, Other (comment)  Visit Diagnosis: Pain, hand  joint, right  Localized edema  Other lack of coordination  Muscle weakness (generalized)    Problem List Patient Active Problem List   Diagnosis Date Noted  . ATTENTION DEFICIT, W/HYPERACTIVITY 12/26/2006  . ASTHMA, UNSPECIFIED 12/26/2006  . GASTROESOPHAGEAL REFLUX, NO ESOPHAGITIS 12/26/2006  . ACNE 12/26/2006    Joshua Russo 04/05/2016, 1:05 PM Joshua Russo, OTR/L [phone] Phone: [phone] 1:05 PM 04/05/2016 [facility] [address] [place], [place], [address] Phone: [phone]   Fax:  [phone]  Name:  Joshua Russo MRN: [id number] Date of Birth: Jan 04, 1991

## 2016-04-12 ENCOUNTER — Ambulatory Visit: Payer: Medicaid Other | Admitting: Occupational Therapy

## 2016-04-26 ENCOUNTER — Encounter: Payer: Medicaid Other | Admitting: Occupational Therapy

## 2020-07-25 ENCOUNTER — Other Ambulatory Visit: Payer: Self-pay

## 2020-07-25 ENCOUNTER — Encounter (HOSPITAL_BASED_OUTPATIENT_CLINIC_OR_DEPARTMENT_OTHER): Payer: Self-pay | Admitting: *Deleted

## 2020-07-25 ENCOUNTER — Emergency Department (HOSPITAL_BASED_OUTPATIENT_CLINIC_OR_DEPARTMENT_OTHER): Payer: 59

## 2020-07-25 ENCOUNTER — Emergency Department (HOSPITAL_BASED_OUTPATIENT_CLINIC_OR_DEPARTMENT_OTHER)
Admission: EM | Admit: 2020-07-25 | Discharge: 2020-07-25 | Disposition: A | Discharge status: Home / Self Care | Payer: 59 | Attending: Emergency Medicine | Admitting: Emergency Medicine

## 2020-07-25 DIAGNOSIS — J45909 Unspecified asthma, uncomplicated: Secondary | ICD-10-CM | POA: Diagnosis not present

## 2020-07-25 DIAGNOSIS — F17210 Nicotine dependence, cigarettes, uncomplicated: Secondary | ICD-10-CM | POA: Diagnosis not present

## 2020-07-25 DIAGNOSIS — M25512 Pain in left shoulder: Secondary | ICD-10-CM

## 2020-07-25 DIAGNOSIS — Y9389 Activity, other specified: Secondary | ICD-10-CM | POA: Diagnosis not present

## 2020-07-25 DIAGNOSIS — M25551 Pain in right hip: Secondary | ICD-10-CM | POA: Diagnosis not present

## 2020-07-25 DIAGNOSIS — Y9289 Other specified places as the place of occurrence of the external cause: Secondary | ICD-10-CM | POA: Insufficient documentation

## 2020-07-25 DIAGNOSIS — R52 Pain, unspecified: Secondary | ICD-10-CM

## 2020-07-25 MED ORDER — METHOCARBAMOL 500 MG PO TABS: 500.0000 mg | ORAL_TABLET | Freq: Every evening | ORAL | 0 refills | Status: AC | PRN
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
## 2020-07-25 NOTE — Discharge Instructions (Signed)
Take ibuprofen 3 times a day with meals.  Do not take other anti-inflammatories at the same time (Advil, Motrin, naproxen, Aleve). You may supplement with Tylenol if you need further pain control. Use Robaxin as needed for muscle stiffness or soreness. Have caution, as this may make you tired or groggy. Do not drive or operate heavy machinery while taking this medication.  Use muscle creams (bengay, icy hot, salonpas) as needed for pain.  Follow up with your primary care doctor if pain is not improving with this treatment in 2 weeks.  Return to the ER if you develop high fevers, numbness, loss of bowel or bladder control, or any new or concerning symptoms.
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
## 2021-05-21 ENCOUNTER — Encounter (HOSPITAL_BASED_OUTPATIENT_CLINIC_OR_DEPARTMENT_OTHER): Payer: Self-pay | Admitting: Emergency Medicine

## 2021-05-21 DIAGNOSIS — M545 Low back pain, unspecified: Secondary | ICD-10-CM | POA: Diagnosis not present

## 2021-05-21 DIAGNOSIS — Z5321 Procedure and treatment not carried out due to patient leaving prior to being seen by health care provider: Secondary | ICD-10-CM | POA: Insufficient documentation
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
## 2021-05-22 ENCOUNTER — Emergency Department (HOSPITAL_BASED_OUTPATIENT_CLINIC_OR_DEPARTMENT_OTHER)
Admission: EM | Admit: 2021-05-22 | Discharge: 2021-05-22 | Disposition: A | Discharge status: Home / Self Care | Payer: Medicaid Other | Attending: Emergency Medicine | Admitting: Emergency Medicine

## 2021-09-23 ENCOUNTER — Emergency Department (HOSPITAL_BASED_OUTPATIENT_CLINIC_OR_DEPARTMENT_OTHER)
Admission: EM | Admit: 2021-09-23 | Discharge: 2021-09-23 | Discharge status: Against Medical Advice | Payer: Medicaid Other | Source: Home / Self Care

## 2021-09-23 ENCOUNTER — Other Ambulatory Visit: Payer: Self-pay
